# Patient Record
Sex: Male | Born: 1960 | Race: White | Hispanic: No | Marital: Married | Smoking: Former smoker
Health system: Southern US, Community
[De-identification: ages and names within clinical notes are randomized; demographics above are authoritative.]

## PROBLEM LIST (undated history)

## (undated) DIAGNOSIS — C140 Malignant neoplasm of pharynx, unspecified: Secondary | ICD-10-CM

## (undated) HISTORY — DX: Malignant neoplasm of pharynx, unspecified: C14.0

## (undated) HISTORY — PX: GASTROSTOMY W/ FEEDING TUBE: SUR642

---

## 2001-05-22 ENCOUNTER — Emergency Department (HOSPITAL_COMMUNITY): Admission: EM | Admit: 2001-05-22 | Discharge: 2001-05-23 | Payer: Self-pay | Admitting: Emergency Medicine

## 2001-05-23 ENCOUNTER — Encounter: Payer: Self-pay | Admitting: Emergency Medicine

## 2015-11-25 DIAGNOSIS — C099 Malignant neoplasm of tonsil, unspecified: Secondary | ICD-10-CM | POA: Diagnosis not present

## 2015-12-13 DIAGNOSIS — C099 Malignant neoplasm of tonsil, unspecified: Secondary | ICD-10-CM | POA: Diagnosis not present

## 2016-01-03 DIAGNOSIS — H9109 Ototoxic hearing loss, unspecified ear: Secondary | ICD-10-CM | POA: Diagnosis not present

## 2016-01-03 DIAGNOSIS — R42 Dizziness and giddiness: Secondary | ICD-10-CM | POA: Diagnosis not present

## 2016-01-03 DIAGNOSIS — C099 Malignant neoplasm of tonsil, unspecified: Secondary | ICD-10-CM | POA: Diagnosis not present

## 2016-01-24 DIAGNOSIS — C099 Malignant neoplasm of tonsil, unspecified: Secondary | ICD-10-CM | POA: Diagnosis not present

## 2016-03-06 DIAGNOSIS — E860 Dehydration: Secondary | ICD-10-CM | POA: Diagnosis not present

## 2016-03-06 DIAGNOSIS — R531 Weakness: Secondary | ICD-10-CM | POA: Diagnosis not present

## 2016-03-06 DIAGNOSIS — C099 Malignant neoplasm of tonsil, unspecified: Secondary | ICD-10-CM | POA: Diagnosis not present

## 2016-04-17 DIAGNOSIS — K1239 Other oral mucositis (ulcerative): Secondary | ICD-10-CM

## 2016-04-17 DIAGNOSIS — C099 Malignant neoplasm of tonsil, unspecified: Secondary | ICD-10-CM | POA: Diagnosis not present

## 2016-08-15 DIAGNOSIS — Z8579 Personal history of other malignant neoplasms of lymphoid, hematopoietic and related tissues: Secondary | ICD-10-CM | POA: Diagnosis not present

## 2016-08-15 DIAGNOSIS — Z9221 Personal history of antineoplastic chemotherapy: Secondary | ICD-10-CM

## 2016-08-15 DIAGNOSIS — Z923 Personal history of irradiation: Secondary | ICD-10-CM

## 2016-12-15 DIAGNOSIS — Z923 Personal history of irradiation: Secondary | ICD-10-CM

## 2016-12-15 DIAGNOSIS — Z9221 Personal history of antineoplastic chemotherapy: Secondary | ICD-10-CM

## 2016-12-15 DIAGNOSIS — Z8579 Personal history of other malignant neoplasms of lymphoid, hematopoietic and related tissues: Secondary | ICD-10-CM

## 2017-04-30 DIAGNOSIS — Z9221 Personal history of antineoplastic chemotherapy: Secondary | ICD-10-CM

## 2017-04-30 DIAGNOSIS — Z8579 Personal history of other malignant neoplasms of lymphoid, hematopoietic and related tissues: Secondary | ICD-10-CM

## 2017-04-30 DIAGNOSIS — Z923 Personal history of irradiation: Secondary | ICD-10-CM

## 2017-08-28 DIAGNOSIS — Z923 Personal history of irradiation: Secondary | ICD-10-CM

## 2017-08-28 DIAGNOSIS — Z8579 Personal history of other malignant neoplasms of lymphoid, hematopoietic and related tissues: Secondary | ICD-10-CM

## 2017-08-28 DIAGNOSIS — Z9221 Personal history of antineoplastic chemotherapy: Secondary | ICD-10-CM

## 2018-01-01 DIAGNOSIS — R946 Abnormal results of thyroid function studies: Secondary | ICD-10-CM

## 2018-01-01 DIAGNOSIS — Z923 Personal history of irradiation: Secondary | ICD-10-CM

## 2018-01-01 DIAGNOSIS — Z8579 Personal history of other malignant neoplasms of lymphoid, hematopoietic and related tissues: Secondary | ICD-10-CM

## 2018-01-01 DIAGNOSIS — Z9221 Personal history of antineoplastic chemotherapy: Secondary | ICD-10-CM

## 2018-07-04 DIAGNOSIS — Z8579 Personal history of other malignant neoplasms of lymphoid, hematopoietic and related tissues: Secondary | ICD-10-CM

## 2019-01-03 DIAGNOSIS — Z8579 Personal history of other malignant neoplasms of lymphoid, hematopoietic and related tissues: Secondary | ICD-10-CM

## 2019-07-04 DIAGNOSIS — C099 Malignant neoplasm of tonsil, unspecified: Secondary | ICD-10-CM

## 2020-06-14 ENCOUNTER — Encounter: Payer: Self-pay | Admitting: Oncology

## 2020-06-14 DIAGNOSIS — C099 Malignant neoplasm of tonsil, unspecified: Secondary | ICD-10-CM | POA: Insufficient documentation

## 2020-06-15 ENCOUNTER — Inpatient Hospital Stay: Payer: Self-pay

## 2020-06-15 ENCOUNTER — Other Ambulatory Visit: Payer: Self-pay

## 2020-06-15 ENCOUNTER — Other Ambulatory Visit: Payer: Self-pay | Admitting: Oncology

## 2020-06-15 ENCOUNTER — Inpatient Hospital Stay: Payer: Self-pay | Attending: Oncology | Admitting: Oncology

## 2020-06-15 ENCOUNTER — Other Ambulatory Visit: Payer: Self-pay | Admitting: Hematology and Oncology

## 2020-06-15 DIAGNOSIS — C099 Malignant neoplasm of tonsil, unspecified: Secondary | ICD-10-CM

## 2020-06-15 LAB — BASIC METABOLIC PANEL
BUN: 14 (ref 4–21)
CO2: 27 — AB (ref 13–22)
Chloride: 99 (ref 99–108)
Creatinine: 0.7 (ref 0.6–1.3)
Glucose: 94
Potassium: 4 (ref 3.4–5.3)
Sodium: 132 — AB (ref 137–147)

## 2020-06-15 LAB — CBC AND DIFFERENTIAL
HCT: 39 — AB (ref 41–53)
Hemoglobin: 13.8 (ref 13.5–17.5)
Neutrophils Absolute: 4.46
Platelets: 200 (ref 150–399)
WBC: 6.2

## 2020-06-15 LAB — HEPATIC FUNCTION PANEL
ALT: 19 (ref 10–40)
AST: 27 (ref 14–40)
Alkaline Phosphatase: 68 (ref 25–125)
Bilirubin, Total: 0.3

## 2020-06-15 LAB — COMPREHENSIVE METABOLIC PANEL
Albumin: 4.6 (ref 3.5–5.0)
Calcium: 9.1 (ref 8.7–10.7)

## 2020-06-15 LAB — CBC: RBC: 4.06 (ref 3.87–5.11)

## 2020-06-15 LAB — TSH: TSH: 4.304 u[IU]/mL (ref 0.350–4.500)

## 2020-06-22 NOTE — Progress Notes (Signed)
[facility]  [address] [place],  [place]  [address] [phone]  Clinic Day:  06/22/2020  Referring physician: Charlynn Court, NP   HISTORY OF PRESENT ILLNESS:  The patient is a 60 y.o. 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  Since then, all follow-up scans and exams have shown no evidence of disease recurrence.  He comes in today for routine followup.  Since his last visit, the patient has been doing well.  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  He denies having new oropharyngeal symptoms or findings which concern him for possible disease recurrence.   PHYSICAL EXAM:  Blood pressure 128/75, pulse 99, temperature 98.4 F (36.9 C), resp. rate 16, height 5' 9.5" (1.765 m), weight 132 lb 3.2 oz (60 kg), SpO2 96 %. Wt Readings from Last 3 Encounters:  06/15/20 132 lb 3.2 oz (60 kg)   Body mass index is 19.24 kg/m. Performance status (ECOG): 1 - Symptomatic but completely ambulatory Physical Exam Constitutional:      Appearance: Normal appearance. He is not ill-appearing.  HENT:     Mouth/Throat:     Mouth: Mucous membranes are moist.     Pharynx: Oropharynx is clear. No oropharyngeal exudate or posterior oropharyngeal erythema.  Cardiovascular:     Rate and Rhythm: Normal rate and regular rhythm.     Heart sounds: No murmur heard. No friction rub. No gallop.   Pulmonary:     Effort: Pulmonary effort is normal. No respiratory distress.     Breath sounds: Normal breath sounds. No wheezing, rhonchi or rales.  Chest:  Breasts:     Right: No axillary adenopathy or supraclavicular adenopathy.     Left: No axillary adenopathy or supraclavicular adenopathy.    Abdominal:     General: Bowel sounds are normal. There is no distension.     Palpations: Abdomen is soft. There is no mass.      Tenderness: There is no abdominal tenderness.  Musculoskeletal:        General: No swelling.     Right lower leg: No edema.     Left lower leg: No edema.  Lymphadenopathy:     Cervical: No cervical adenopathy.     Upper Body:     Right upper body: No supraclavicular or axillary adenopathy.     Left upper body: No supraclavicular or axillary adenopathy.     Lower Body: No right inguinal adenopathy. No left inguinal adenopathy.  Skin:    General: Skin is warm.     Coloration: Skin is not jaundiced.     Findings: No lesion or rash.  Neurological:     General: No focal deficit present.     Mental Status: He is alert and oriented to person, place, and time. Mental status is at baseline.     Cranial Nerves: Cranial nerves are intact.  Psychiatric:        Mood and Affect: Mood normal.        Behavior: Behavior normal.        Thought Content: Thought content normal.     LABS:   CBC Latest Ref Rng & Units 06/15/2020  WBC - 6.2  Hemoglobin 13.5 - 17.5 13.8  Hematocrit 41 - 53 39(A)  Platelets 150 - 399 200   CMP Latest Ref Rng & Units 06/15/2020  BUN 4 - 21 14  Creatinine 0.6 - 1.3 0.7  Sodium 137 - 147 132(A)  Potassium 3.4 - 5.3 4.0  Chloride 99 - 108 99  CO2 13 - 22 27(A)  Calcium 8.7 - 10.7 9.1  Alkaline Phos 25 - 125 68  AST 14 - 40 27  ALT 10 - 40 19    Ref. Range 06/15/2020 15:59  TSH Latest Ref Range: 0.350 - 4.500 uIU/mL 4.304    ASSESSMENT & PLAN:   Assessment/Plan:  A 60 y.o. 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  Based upon his physical exam today, the patient remains disease-free.  Clinically, the patient appears to be doing very well.  I will see him back in November 2022 for repeat clinical assessment.  If he is cancer free at that time, it will mark her being 5 years disease free for which I would consider him cured of his disease.  The patient understands  all the plans discussed today and is in agreement with them.      Raevin Wierenga Macarthur Critchley, MD

## 2020-06-23 ENCOUNTER — Other Ambulatory Visit: Payer: Self-pay | Admitting: Oncology

## 2020-06-23 DIAGNOSIS — C099 Malignant neoplasm of tonsil, unspecified: Secondary | ICD-10-CM

## 2021-01-12 ENCOUNTER — Telehealth: Payer: Self-pay | Admitting: Oncology

## 2021-01-12 NOTE — Telephone Encounter (Signed)
Patient requested earlier Appt - Patient scheduled for 10/27 Labs 3:30 pm - Follow Up 4:00 pm.  Found lump behind ear

## 2021-01-12 NOTE — Progress Notes (Signed)
[facility]  [address] [place],    [address] [phone]  Clinic Day:  01/13/2021  Referring physician: Charlynn Court, NP   HISTORY OF PRESENT ILLNESS:  The patient is a 60 y.o. 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  Since then, all follow-up scans and exams have shown no evidence of disease recurrence.  He comes in today for routine followup.  Since his last visit, the patient has been doing okay.  He has a few concerns today, including moderate dysphagia.  He also feels fullness in front of and behind his left ear.  Overall, he and his wife are concerned about late disease recurrence.    PHYSICAL EXAM:  Blood pressure 115/75, pulse 81, temperature 98.3 F (36.8 C), resp. rate 16, height 5' 9.5" (1.765 m), weight 128 lb 6.4 oz (58.2 kg), SpO2 97 %. Wt Readings from Last 3 Encounters:  01/13/21 128 lb 6.4 oz (58.2 kg)  06/15/20 132 lb 3.2 oz (60 kg)   Body mass index is 18.69 kg/m. Performance status (ECOG): 1 - Symptomatic but completely ambulatory Physical Exam Constitutional:      Appearance: Normal appearance. He is not ill-appearing.  HENT:     Right Ear: External ear normal.     Left Ear: External ear normal.     Ears:     Comments: No palpable masses or lymphadenopathy around his left ear    Mouth/Throat:     Mouth: Mucous membranes are moist.     Pharynx: Oropharynx is clear. No oropharyngeal exudate or posterior oropharyngeal erythema.  Neck:     Comments: Taut from prior radiation Cardiovascular:     Rate and Rhythm: Normal rate and regular rhythm.     Heart sounds: No murmur heard.   No friction rub. No gallop.  Pulmonary:     Effort: Pulmonary effort is normal. No respiratory distress.     Breath sounds: Normal breath sounds. No wheezing, rhonchi or rales.  Abdominal:     General: Bowel sounds are normal. There is  no distension.     Palpations: Abdomen is soft. There is no mass.     Tenderness: There is no abdominal tenderness.  Musculoskeletal:        General: No swelling.     Right lower leg: No edema.     Left lower leg: No edema.  Lymphadenopathy:     Cervical: No cervical adenopathy.     Upper Body:     Right upper body: No supraclavicular or axillary adenopathy.     Left upper body: No supraclavicular or axillary adenopathy.     Lower Body: No right inguinal adenopathy. No left inguinal adenopathy.  Skin:    General: Skin is warm.     Coloration: Skin is not jaundiced.     Findings: No lesion or rash.  Neurological:     General: No focal deficit present.     Mental Status: He is alert and oriented to person, place, and time. Mental status is at baseline.  Psychiatric:        Mood and Affect: Mood normal.        Behavior: Behavior normal.        Thought Content: Thought content normal.    LABS:   CBC Latest Ref Rng & Units 01/13/2021 06/15/2020  WBC - 6.4 6.2  Hemoglobin 13.5 - 17.5 13.7 13.8  Hematocrit 41 - 53 40(A) 39(A)  Platelets 150 - 399 180 200   CMP Latest Ref Rng & Units 01/13/2021 06/15/2020  BUN 4 - 21 14 14   Creatinine 0.6 - 1.3 1.0 0.7  Sodium 137 - 147 135(A) 132(A)  Potassium 3.4 - 5.3 4.1 4.0  Chloride 99 - 108 104 99  CO2 13 - 22 25(A) 27(A)  Calcium 8.7 - 10.7 8.4(A) 9.1  Alkaline Phos 25 - 125 79 68  AST 14 - 40 27 27  ALT 10 - 40 16 19   ASSESSMENT & PLAN:  Assessment/Plan:  A 60 y.o. 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  Based upon his physical exam today, I do not appreciate anything that concerns me for late disease recurrence.  Despite hearing this, the patient and his wife are concerned with his perceived findings.  Although I really do not believe anything ominous is present around his left ear, I will have him undergo CT scans of his neck and  sinuses for further clarification.  I also recommended he see a GI specialist as his dysphagia may be postradiation induced.  I reinforced multiple times today that I do not sense anything per his history or physical exam that is worrisome for late disease recurrence.  I will see him back net week to go over his CT scans.  If his scans show him to remain disease free, it will mark him being 5 years disease free for which I would consider him cured of his disease.  The patient and his wife understand all the plans discussed today and are in agreement with them.    Carmin Dibartolo Macarthur Critchley, MD

## 2021-01-13 ENCOUNTER — Inpatient Hospital Stay (HOSPITAL_BASED_OUTPATIENT_CLINIC_OR_DEPARTMENT_OTHER): Payer: Self-pay | Admitting: Oncology

## 2021-01-13 ENCOUNTER — Other Ambulatory Visit: Payer: Self-pay | Admitting: Oncology

## 2021-01-13 ENCOUNTER — Inpatient Hospital Stay: Payer: Self-pay | Attending: Oncology

## 2021-01-13 ENCOUNTER — Other Ambulatory Visit: Payer: Self-pay | Admitting: Hematology and Oncology

## 2021-01-13 VITALS — BP 115/75 | HR 81 | Temp 98.3°F | Resp 16 | Ht 69.5 in | Wt 128.4 lb

## 2021-01-13 DIAGNOSIS — Z923 Personal history of irradiation: Secondary | ICD-10-CM | POA: Insufficient documentation

## 2021-01-13 DIAGNOSIS — C099 Malignant neoplasm of tonsil, unspecified: Secondary | ICD-10-CM

## 2021-01-13 DIAGNOSIS — R1310 Dysphagia, unspecified: Secondary | ICD-10-CM | POA: Insufficient documentation

## 2021-01-13 DIAGNOSIS — Z8589 Personal history of malignant neoplasm of other organs and systems: Secondary | ICD-10-CM | POA: Insufficient documentation

## 2021-01-13 LAB — CBC
MCV: 97 — AB (ref 80–94)
RBC: 4.12 (ref 3.87–5.11)

## 2021-01-13 LAB — COMPREHENSIVE METABOLIC PANEL
Albumin: 4.2 (ref 3.5–5.0)
Calcium: 8.4 — AB (ref 8.7–10.7)

## 2021-01-13 LAB — TSH: TSH: 5.127 u[IU]/mL — ABNORMAL HIGH (ref 0.350–4.500)

## 2021-01-13 LAB — CBC AND DIFFERENTIAL
HCT: 40 — AB (ref 41–53)
Hemoglobin: 13.7 (ref 13.5–17.5)
Neutrophils Absolute: 4.8
Platelets: 180 (ref 150–399)
WBC: 6.4

## 2021-01-13 LAB — BASIC METABOLIC PANEL
BUN: 14 (ref 4–21)
CO2: 25 — AB (ref 13–22)
Chloride: 104 (ref 99–108)
Creatinine: 1 (ref 0.6–1.3)
Glucose: 92
Potassium: 4.1 (ref 3.4–5.3)
Sodium: 135 — AB (ref 137–147)

## 2021-01-13 LAB — HEPATIC FUNCTION PANEL
ALT: 16 (ref 10–40)
AST: 27 (ref 14–40)
Alkaline Phosphatase: 79 (ref 25–125)
Bilirubin, Total: 0.4

## 2021-01-14 ENCOUNTER — Telehealth: Payer: Self-pay | Admitting: Oncology

## 2021-01-14 NOTE — Progress Notes (Incomplete)
[facility]  [address] [place],  [place]  [address] [phone]  Clinic Day:  01/14/2021  Referring physician: Charlynn Court, NP  This document serves as a record of services personally performed by [name], MD. It was created on their behalf by Curry,Lauren E, a trained medical scribe. The creation of this record is based on the scribe's personal observations and the provider's statements to them.  HISTORY OF PRESENT ILLNESS:  The patient is a 60 y.o. 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  Since then, all follow-up scans and exams have shown no evidence of disease recurrence.  He comes in today for routine followup.  Since his last visit, the patient has been doing okay.  He has a few concerns today, including moderate dysphagia.  He also feels fullness in front of and behind his left ear.  Overall, he and his wife are concerned about late disease recurrence.    PHYSICAL EXAM:  There were no vitals taken for this visit. Wt Readings from Last 3 Encounters:  01/13/21 128 lb 6.4 oz (58.2 kg)  06/15/20 132 lb 3.2 oz (60 kg)   There is no height or weight on file to calculate BMI. Performance status (ECOG): 1 - Symptomatic but completely ambulatory Physical Exam Constitutional:      Appearance: Normal appearance. He is not ill-appearing.  HENT:     Right Ear: External ear normal.     Left Ear: External ear normal.     Ears:     Comments: No palpable masses or lymphadenopathy around his left ear    Mouth/Throat:     Mouth: Mucous membranes are moist.     Pharynx: Oropharynx is clear. No oropharyngeal exudate or posterior oropharyngeal erythema.  Neck:     Comments: Taut from prior radiation Cardiovascular:     Rate and Rhythm: Normal rate and regular rhythm.     Heart sounds: No murmur heard.   No friction rub. No gallop.  Pulmonary:      Effort: Pulmonary effort is normal. No respiratory distress.     Breath sounds: Normal breath sounds. No wheezing, rhonchi or rales.  Abdominal:     General: Bowel sounds are normal. There is no distension.     Palpations: Abdomen is soft. There is no mass.     Tenderness: There is no abdominal tenderness.  Musculoskeletal:        General: No swelling.     Right lower leg: No edema.     Left lower leg: No edema.  Lymphadenopathy:     Cervical: No cervical adenopathy.     Upper Body:     Right upper body: No supraclavicular or axillary adenopathy.     Left upper body: No supraclavicular or axillary adenopathy.     Lower Body: No right inguinal adenopathy. No left inguinal adenopathy.  Skin:    General: Skin is warm.     Coloration: Skin is not jaundiced.     Findings: No lesion or rash.  Neurological:     General: No focal deficit present.     Mental Status: He is alert and oriented to person, place, and time. Mental status is at baseline.  Psychiatric:        Mood and Affect: Mood normal.        Behavior: Behavior normal.        Thought  Content: Thought content normal.   SCANS: Recent CT head and neck has revealed the following: ***  LABS:   CBC Latest Ref Rng & Units 01/13/2021 06/15/2020  WBC - 6.4 6.2  Hemoglobin 13.5 - 17.5 13.7 13.8  Hematocrit 41 - 53 40(A) 39(A)  Platelets 150 - 399 180 200   CMP Latest Ref Rng & Units 01/13/2021 06/15/2020  BUN 4 - 21 14 14   Creatinine 0.6 - 1.3 1.0 0.7  Sodium 137 - 147 135(A) 132(A)  Potassium 3.4 - 5.3 4.1 4.0  Chloride 99 - 108 104 99  CO2 13 - 22 25(A) 27(A)  Calcium 8.7 - 10.7 8.4(A) 9.1  Alkaline Phos 25 - 125 79 68  AST 14 - 40 27 27  ALT 10 - 40 16 19   ASSESSMENT & PLAN:  Assessment/Plan:  A 60 y.o. 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  Based upon his physical exam today, I do not appreciate anything that  concerns me for late disease recurrence.  Despite hearing this, the patient and his wife are concerned with his perceived findings.  Although I really do not believe anything ominous is present around his left ear, I will have him undergo CT scans of his neck and sinuses for further clarification.  I also recommended he see a GI specialist as his dysphagia may be postradiation induced.  I reinforced multiple times today that I do not sense anything per his history or physical exam that is worrisome for late disease recurrence.  I will see him back next week to go over his CT scans.  If his scans show him to remain disease free, it will mark him being 5 years disease free for which I would consider him cured of his disease.  The patient and his wife understand all the plans discussed today and are in agreement with them.    I, Rita Ohara, am acting as scribe for [name], MD    I have reviewed this report as typed by the medical scribe, and it is complete and accurate.  Dequincy Macarthur Critchley, MD

## 2021-01-14 NOTE — Telephone Encounter (Signed)
01/14/21 LEFT MSG -SCANS SCHEDULED ON 01/19/21@830AM 

## 2021-01-17 ENCOUNTER — Telehealth: Payer: Self-pay | Admitting: Oncology

## 2021-01-17 NOTE — Progress Notes (Signed)
[facility]  [address] [place],  [place]  [address] [phone]  Clinic Day:  01/21/2021  Referring physician: Charlynn Court, NP  This document serves as a record of services personally performed by [name], MD. It was created on their behalf by Curry,Lauren E, a trained medical scribe. The creation of this record is based on the scribe's personal observations and the provider's statements to them.  HISTORY OF PRESENT ILLNESS:  The patient is a 60 y.o. 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  He comes in today to review recent CT scans of his face and neck, which were done as he was concerned about areas of perceived left periauricular fullness.  His main concern is possible late disease recurrence.  The patient brings to my attention that when he turns his neck to the left, the vision in his left eye goes down and he begins feeling lightheaded.    PHYSICAL EXAM:  Blood pressure 108/68, pulse 82, temperature 98 F (36.7 C), resp. rate 16, height 5' 9.5" (1.765 m), weight 128 lb 14.4 oz (58.5 kg), SpO2 97 %. Wt Readings from Last 3 Encounters:  01/21/21 128 lb 14.4 oz (58.5 kg)  01/13/21 128 lb 6.4 oz (58.2 kg)  06/15/20 132 lb 3.2 oz (60 kg)   Body mass index is 18.76 kg/m. Performance status (ECOG): 1 - Symptomatic but completely ambulatory Physical Exam Constitutional:      Appearance: Normal appearance. He is not ill-appearing.  HENT:     Right Ear: External ear normal.     Left Ear: External ear normal.     Ears:     Comments: No palpable masses or lymphadenopathy around his left ear    Mouth/Throat:     Mouth: Mucous membranes are moist.     Pharynx: Oropharynx is clear. No oropharyngeal exudate or posterior oropharyngeal erythema.  Neck:     Comments: Taut from prior radiation Cardiovascular:     Rate and Rhythm: Normal rate  and regular rhythm.     Heart sounds: No murmur heard.   No friction rub. No gallop.  Pulmonary:     Effort: Pulmonary effort is normal. No respiratory distress.     Breath sounds: Normal breath sounds. No wheezing, rhonchi or rales.  Abdominal:     General: Bowel sounds are normal. There is no distension.     Palpations: Abdomen is soft. There is no mass.     Tenderness: There is no abdominal tenderness.  Musculoskeletal:        General: No swelling.     Right lower leg: No edema.     Left lower leg: No edema.  Lymphadenopathy:     Cervical: No cervical adenopathy.     Upper Body:     Right upper body: No supraclavicular or axillary adenopathy.     Left upper body: No supraclavicular or axillary adenopathy.     Lower Body: No right inguinal adenopathy. No left inguinal adenopathy.  Skin:    General: Skin is warm.     Coloration: Skin is not jaundiced.     Findings: No lesion or rash.  Neurological:     General: No focal deficit present.     Mental Status: He is alert and oriented to person, place, and time. Mental status is at baseline.  Psychiatric:        Mood and  Affect: Mood normal.        Behavior: Behavior normal.        Thought Content: Thought content normal.   SCANS: Recent CT imaging of face and neck has revealed the following:  FINDINGS: Osseous: No fracture or bone lesion identified. Orbits: No orbital mass or edema. Sinuses: Mild mucosal edema in the paranasal sinuses. No air-fluid level. Mastoid sinus clear bilaterally. Soft tissues: Diffuse soft tissue edema in the neck bilaterally as well as in the larynx compatible with prior radiation. Bb marks an area of palpable lump behind and inferior to the left ear. There is mild fullness of the soft tissue in this area. Limited evaluation without intravenous contrast. Limited intracranial: No acute abnormality.  IMPRESSION: 1. Mild fullness of the soft tissues posterior left ear. Limited evaluation without  intravenous contrast. It is difficult to determine if this is a lymph node. If further imaging is desired, postcontrast imaging with CT would be helpful. Otherwise MRI without and with contrast may be helpful. 2. Post radiation soft tissue changes in the neck bilaterally. ---------- FINDINGS: Pharynx and larynx: Edema is present in the epiglottis and larynx consistent with prior radiation. No pharyngeal mass. Salivary glands: Limited evaluation without intravenous contrast. No parotid mass. Atrophic submandibular gland due to prior radiation. Thyroid: No focal nodule. Lymph nodes: No enlarged lymph nodes in the neck. Evaluation limited by lack of intravenous contrast. Vascular: Limited evaluation without intravenous contrast. Right jugular Port-A-Cath noted. Limited intracranial: Negative Visualized orbits: Negative Mastoids and visualized paranasal sinuses: Mild mucosal edema ethmoid sinuses. Remaining sinuses clear. Skeleton: No acute skeletal abnormality. Upper chest: Pleuroparenchymal scarring in the apices bilaterally right greater than left. No mass lesion Other: Soft tissue edema is present throughout the neck bilaterally most consistent with prior radiation change.  IMPRESSION: Negative for mass or adenopathy. Post radiation changes throughout the soft tissues of the neck and involving the larynx. Limited evaluation without intravenous contrast.  LABS:   CBC Latest Ref Rng & Units 01/13/2021 06/15/2020  WBC - 6.4 6.2  Hemoglobin 13.5 - 17.5 13.7 13.8  Hematocrit 41 - 53 40(A) 39(A)  Platelets 150 - 399 180 200   CMP Latest Ref Rng & Units 01/13/2021 06/15/2020  BUN 4 - 21 14 14   Creatinine 0.6 - 1.3 1.0 0.7  Sodium 137 - 147 135(A) 132(A)  Potassium 3.4 - 5.3 4.1 4.0  Chloride 99 - 108 104 99  CO2 13 - 22 25(A) 27(A)  Calcium 8.7 - 10.7 8.4(A) 9.1  Alkaline Phos 25 - 125 79 68  AST 14 - 40 27 27  ALT 10 - 40 16 19   ASSESSMENT & PLAN:  Assessment/Plan:  A 60  y.o. 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  In clinic today, I went over his CT scan images with him, for which no obvious disease recurrence is seen.  However, because the study was limited by a lack of contrast, the patient was not comforted by the results of his scans.  Furthermore, he has become concerned with his left-sided vision changes when he turns his neck to the left.  After speaking with radiologoy to figure out the best studies to order, the decision was made to do an MRI of his face and an MRA of his neck. His vision problems have me concerned that his radiation has affected his carotid arteries, limiting the amount of blood flow to  his brain. These studies will be done within the forthcoming days. I once again told him that my index of suspicion for late disease recurrence being present is very low.  I will see this patient back the day after the aforementioned scans are done to go over their results and their implications.  The patient and his wife understand all the plans discussed today and are in agreement with them.     I, Rita Ohara, am acting as scribe for [name], MD    I have reviewed this report as typed by the medical scribe, and it is complete and accurate.  Dequincy Macarthur Critchley, MD

## 2021-01-17 NOTE — Telephone Encounter (Signed)
01/17/21 lft vm about upcoming appts

## 2021-01-20 ENCOUNTER — Ambulatory Visit: Payer: Self-pay | Admitting: Oncology

## 2021-01-21 ENCOUNTER — Encounter: Payer: Self-pay | Admitting: Oncology

## 2021-01-21 ENCOUNTER — Inpatient Hospital Stay: Payer: Self-pay | Attending: Oncology | Admitting: Oncology

## 2021-01-21 ENCOUNTER — Other Ambulatory Visit: Payer: Self-pay | Admitting: Oncology

## 2021-01-21 VITALS — BP 108/68 | HR 82 | Temp 98.0°F | Resp 16 | Ht 69.5 in | Wt 128.9 lb

## 2021-01-21 DIAGNOSIS — C099 Malignant neoplasm of tonsil, unspecified: Secondary | ICD-10-CM

## 2021-01-21 NOTE — Progress Notes (Signed)
Patient needs to get scans through [facility], provided him with financial assistance paperwork. He is going to bring me in completed papers next week, I will submit then.

## 2021-01-24 ENCOUNTER — Ambulatory Visit: Payer: Self-pay | Admitting: Oncology

## 2021-01-24 NOTE — Progress Notes (Incomplete)
Larry Marks  [address] [place],  [place]  [address] [phone]  Clinic Day:  01/28/2021  Referring physician: Charlynn Court, NP  This document serves as a record of services personally performed by Larry Potter, MD. It was created on their behalf by Curry,Lauren E, a trained medical scribe. The creation of this record is based on the scribe's personal observations and the provider's statements to them.  HISTORY OF PRESENT ILLNESS:  The patient is a 60 y.o. 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  He comes in today to review recent MRI scans of his face and neck, which were done as he was concerned about areas of perceived left periauricular fullness.  His main concern is possible late disease recurrence.  The patient brings to my attention that when he turns his neck to the left, the vision in his left eye goes down and he begins feeling lightheaded.    PHYSICAL EXAM:  There were no vitals taken for this visit. Wt Readings from Last 3 Encounters:  01/21/21 128 lb 14.4 oz (58.5 kg)  01/13/21 128 lb 6.4 oz (58.2 kg)  06/15/20 132 lb 3.2 oz (60 kg)   There is no height or weight on file to calculate BMI. Performance status (ECOG): 1 - Symptomatic but completely ambulatory Physical Exam Constitutional:      Appearance: Normal appearance. He is not ill-appearing.  HENT:     Right Ear: External ear normal.     Left Ear: External ear normal.     Ears:     Comments: No palpable masses or lymphadenopathy around his left ear    Mouth/Throat:     Mouth: Mucous membranes are moist.     Pharynx: Oropharynx is clear. No oropharyngeal exudate or posterior oropharyngeal erythema.  Neck:     Comments: Taut from prior radiation Cardiovascular:     Rate and Rhythm: Normal rate and regular rhythm.     Heart sounds: No murmur heard.   No friction rub. No  gallop.  Pulmonary:     Effort: Pulmonary effort is normal. No respiratory distress.     Breath sounds: Normal breath sounds. No wheezing, rhonchi or rales.  Abdominal:     General: Bowel sounds are normal. There is no distension.     Palpations: Abdomen is soft. There is no mass.     Tenderness: There is no abdominal tenderness.  Musculoskeletal:        General: No swelling.     Right lower leg: No edema.     Left lower leg: No edema.  Lymphadenopathy:     Cervical: No cervical adenopathy.     Upper Body:     Right upper body: No supraclavicular or axillary adenopathy.     Left upper body: No supraclavicular or axillary adenopathy.     Lower Body: No right inguinal adenopathy. No left inguinal adenopathy.  Skin:    General: Skin is warm.     Coloration: Skin is not jaundiced.     Findings: No lesion or rash.  Neurological:     General: No focal deficit present.     Mental Status: He is alert and oriented to person, place, and time. Mental status is at baseline.  Psychiatric:        Mood and Affect: Mood normal.        Behavior: Behavior normal.  Thought Content: Thought content normal.   SCANS: MRI imaging has revealed the following: ***  LABS:   CBC Latest Ref Rng & Units 01/13/2021 06/15/2020  WBC - 6.4 6.2  Hemoglobin 13.5 - 17.5 13.7 13.8  Hematocrit 41 - 53 40(A) 39(A)  Platelets 150 - 399 180 200   CMP Latest Ref Rng & Units 01/13/2021 06/15/2020  BUN 4 - 21 14 14   Creatinine 0.6 - 1.3 1.0 0.7  Sodium 137 - 147 135(A) 132(A)  Potassium 3.4 - 5.3 4.1 4.0  Chloride 99 - 108 104 99  CO2 13 - 22 25(A) 27(A)  Calcium 8.7 - 10.7 8.4(A) 9.1  Alkaline Phos 25 - 125 79 68  AST 14 - 40 27 27  ALT 10 - 40 16 19   ASSESSMENT & PLAN:  Assessment/Plan:  A 60 y.o. 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  In clinic today, I went over his CT scan images with him, for  which no obvious disease recurrence is seen.  However, because the study was limited by a lack of contrast, the patient was not comforted by the results of his scans.  Furthermore, he has become concerned with his left-sided vision changes when he turns his neck to the left.  After speaking with radiologoy to figure out the best studies to order, the decision was made to do an MRI of his face and  an MRA of his neck. His vision problems have me concerned that his radiation has affected his carotid arteries, limiting the amount of blood flow to his brain. These studies will be done within the forthcoming days. I once again told him that my index of suspicion for late disease recurrence being present is very low.  I will see this patient back the day after the aforementioned scans are done to go over their results and their implications.  The patient and his wife understand all the plans discussed today and are in agreement with them.     I, Rita Ohara, am acting as scribe for Larry Potter, MD    I have reviewed this report as typed by the medical scribe, and it is complete and accurate.  Dequincy Macarthur Critchley, MD

## 2021-01-28 ENCOUNTER — Ambulatory Visit: Payer: Self-pay | Admitting: Oncology

## 2021-01-28 ENCOUNTER — Inpatient Hospital Stay (HOSPITAL_BASED_OUTPATIENT_CLINIC_OR_DEPARTMENT_OTHER): Payer: Self-pay | Admitting: Oncology

## 2021-01-28 VITALS — BP 115/66 | HR 112 | Temp 98.2°F | Resp 16 | Ht 69.5 in | Wt 128.5 lb

## 2021-01-28 DIAGNOSIS — C099 Malignant neoplasm of tonsil, unspecified: Secondary | ICD-10-CM

## 2021-01-28 NOTE — Progress Notes (Signed)
[facility]  [address] [place],  [place]  [address] [phone]  Clinic Day:  01/28/2021  Referring physician: Charlynn Court, NP  This document serves as a record of services personally performed by [name], MD. It was created on their behalf by Curry,Lauren E, a trained medical scribe. The creation of this record is based on the scribe's personal observations and the provider's statements to them.  HISTORY OF PRESENT ILLNESS:  The patient is a 60 y.o. 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  He comes in today to review recent MRI scans of his face and neck, which were done as he was concerned about areas of perceived left periauricular fullness being recurrent disease. He still complains that when he turns his neck to the left, the vision in his left eye goes down and he begins feeling lightheaded.    PHYSICAL EXAM:  Blood pressure 115/66, pulse (!) 112, temperature 98.2 F (36.8 C), resp. rate 16, height 5' 9.5" (1.765 m), weight 128 lb 8 oz (58.3 kg), SpO2 98 %. Wt Readings from Last 3 Encounters:  01/28/21 128 lb 8 oz (58.3 kg)  01/21/21 128 lb 14.4 oz (58.5 kg)  01/13/21 128 lb 6.4 oz (58.2 kg)   Body mass index is 18.7 kg/m. Performance status (ECOG): 1 - Symptomatic but completely ambulatory Physical Exam Constitutional:      Appearance: Normal appearance. He is not ill-appearing.  HENT:     Right Ear: External ear normal.     Left Ear: External ear normal.     Ears:     Comments: No palpable masses or lymphadenopathy around his left ear    Mouth/Throat:     Mouth: Mucous membranes are moist.     Pharynx: Oropharynx is clear. No oropharyngeal exudate or posterior oropharyngeal erythema.  Neck:     Comments: Taut from prior radiation Cardiovascular:     Rate and Rhythm: Normal rate and regular rhythm.     Heart sounds: No  murmur heard.   No friction rub. No gallop.  Pulmonary:     Effort: Pulmonary effort is normal. No respiratory distress.     Breath sounds: Normal breath sounds. No wheezing, rhonchi or rales.  Abdominal:     General: Bowel sounds are normal. There is no distension.     Palpations: Abdomen is soft. There is no mass.     Tenderness: There is no abdominal tenderness.  Musculoskeletal:        General: No swelling.     Right lower leg: No edema.     Left lower leg: No edema.  Lymphadenopathy:     Cervical: No cervical adenopathy.     Upper Body:     Right upper body: No supraclavicular or axillary adenopathy.     Left upper body: No supraclavicular or axillary adenopathy.     Lower Body: No right inguinal adenopathy. No left inguinal adenopathy.  Skin:    General: Skin is warm.     Coloration: Skin is not jaundiced.     Findings: No lesion or rash.  Neurological:     General: No focal deficit present.     Mental Status: He is alert and oriented to person, place, and time. Mental status is at baseline.  Psychiatric:        Mood and Affect: Mood normal.  Behavior: Behavior normal.        Thought Content: Thought content normal.   SCANS: Recent imaging has revealed the following:   FINDINGS:  There is mucosal and submucosal edema and enhancement throughout the  oropharynx, hypopharynx and supraglottic region to the glottis  consistent with chronic radiation change. There is no evidence of a  focal recurrent mass lesion in the region.  Parotid and submandibular glands show atrophic changes without a  focal lesion.  The thyroid gland is normal.  There is no lymphadenopathy noted on either side of the neck.  Regional bones appear unremarkable, with post radiation fatty  change. No sign of metastatic disease to bone.  Posterior fossa intracranial contents are unremarkable. Visualized  sinuses are clear.  No abnormality seen at the lung apices.  IMPRESSION:   Post radiation  mucosal and submucosal changes as above. No evidence  of residual or recurrent disease or lymphadenopathy.   LABS:   CBC Latest Ref Rng & Units 01/13/2021 06/15/2020  WBC - 6.4 6.2  Hemoglobin 13.5 - 17.5 13.7 13.8  Hematocrit 41 - 53 40(A) 39(A)  Platelets 150 - 399 180 200   CMP Latest Ref Rng & Units 01/13/2021 06/15/2020  BUN 4 - 21 14 14   Creatinine 0.6 - 1.3 1.0 0.7  Sodium 137 - 147 135(A) 132(A)  Potassium 3.4 - 5.3 4.1 4.0  Chloride 99 - 108 104 99  CO2 13 - 22 25(A) 27(A)  Calcium 8.7 - 10.7 8.4(A) 9.1  Alkaline Phos 25 - 125 79 68  AST 14 - 40 27 27  ALT 10 - 40 16 19   ASSESSMENT & PLAN:  Assessment/Plan:  A 60 y.o. 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 In clinic, I reviewed his MRI  images with him, for which he could see there is no evidence of disease recurrence.  As he is 5 years cancer free, I do feel very comfortable in considering him cured of his disease.  Nevertheless, I will refer him to Neurology for this symptoms he has when turning his head to the left.  Otherwise, I will turn his care back over to his other physicians.  The patient and his wife understand all the plans discussed today and are in agreement with them.     I, Rita Ohara, am acting as scribe for [name], MD    I have reviewed this report as typed by the medical scribe, and it is complete and accurate.  Acelyn Basham Macarthur Critchley, MD

## 2021-02-02 ENCOUNTER — Encounter: Payer: Self-pay | Admitting: Neurology

## 2021-02-07 ENCOUNTER — Telehealth: Payer: Self-pay | Admitting: Oncology

## 2021-02-07 NOTE — Telephone Encounter (Signed)
Per 11/11 Office Note:  Patient released from Clinic

## 2021-02-14 ENCOUNTER — Encounter: Payer: Self-pay | Admitting: Oncology

## 2021-03-22 NOTE — Progress Notes (Signed)
NEUROLOGY CONSULTATION NOTE  Larry Marks MRN: [id number] DOB: [birth year]  Referring provider: Lavera Guise, MD Primary care provider: Waldemar Dickens  Reason for consult:  near syncope  Assessment/Plan:   Vision loss with head turn to left - semiology seems consistent with vascular etiology due to compression of the carotid artery or subclavian steal.  He reports that he had an MRA of the neck performed.  I don't have imaging.  The report does not describe the extracranial vessels.  Will contact [facility] to see if there is an MRA report. 2.  Left facial nerve palsy - Bell's palsy unrelated to above symptoms but given prior history, would evaluate for other etiologies  MRI of brain and cervical spine with and without contrast WIll obtain MRA of neck.  Further recommendations pending results. If MRI unrevealing and facial weakness not significantly improved in 6 months, would evaluate for other etiologies.  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  History supplemented by referring provider's note.  Patient completed chemoradiation in November 2017 for squamous cell carcinoma of his left tonsil.  He developed tightness of his neck following treatment and has decreased range of motion  Around 2019, he would start experiencing vision loss in his left eye and tingling on the left side of his head.  He would quickly turn his neck back before symptoms progressed.  For the past few months, symptoms have gotten worse.  He has an aural fullness in his left ear with numbness behind the ear.  He started noticing knots on the left side of his neck.  Sometimes if he would press it, he would feel the left sided numbness of the head and neck.  No pain or numbness involving the left arm.  He was concerned about recurrent disease.  CT face and soft tissue of neck in November showed mild  fullness of the soft tissues posterior to left ear and post radiation soft tissue changes in the neck bilaterally.  MRI of orbit, face and neck on 01/26/2021 showed post radiation mucosal and submucosal changes but no evidence of residual or recurrent SSC or lymphadenopathy.  His PCP thought that the "knots" were likely muscle spasms and he was prescribed muscle relaxers and prednisone which helped but symptoms returned once he finished the prednisone  About 3 weeks ago, he developed left upper and lower facial weakness.  It progressed over 2 days.  He cannot close his eyes and he has left facial droop.  No associated facial numbness, altered taste or hyperacusis in left ear.  He was diagnosed with Bell's palsy.  He did not receive prednisone or valacyclovir.  He also has noticed some increased trouble swallowing.    PAST MEDICAL HISTORY: Past Medical History:  Diagnosis Date   Throat cancer [facility])     MEDICATIONS: No current outpatient medications on file prior to visit.   No current facility-administered medications on file prior to visit.      ALLERGIES: Allergies  Allergen Reactions   Iodine Anaphylaxis   Other Anaphylaxis   Shellfish Allergy Anaphylaxis   Penicillins Hives    Other reaction(s): Other (See Comments) Unknown    Ivp Dye [Iodinated Contrast Media]     FAMILY HISTORY: No family history on file.  Objective:  Blood pressure 124/78, pulse 88, height 5\' 9"  (1.753 m), weight 130 lb 12.8 oz (59.3 kg), SpO2  96 %. General: No acute distress.  Patient appears well-groomed.   Head:  Normocephalic/atraumatic Eyes:  fundi examined but not visualized Neck: tight but no paraspinal tenderness, decreased range of motion in all directions primarily neck turn to left. Back: No paraspinal tenderness Heart: regular rate and rhythm Lungs: Clear to auscultation bilaterally. Vascular: No carotid bruits. Neurological Exam: Mental status: alert and oriented to person, place, and time,  recent and remote memory intact, fund of knowledge intact, attention and concentration intact, speech fluent and not dysarthric, language intact. Cranial nerves: CN I: not tested CN II: pupils equal, round and reactive to light, visual fields intact CN III, IV, VI:  full range of motion, no nystagmus, no ptosis CN V: facial sensation intact. CN VII: left upper and lower facial weakness CN VIII: hearing intact CN IX, X: gag intact, uvula midline CN XI: sternocleidomastoid and trapezius muscles intact CN XII: tongue midline Bulk & Tone: normal, no fasciculations. Motor:  muscle strength 5/5 throughout Sensation:  Pinprick, temperature and vibratory sensation intact. Deep Tendon Reflexes:  2+ throughout,  toes downgoing.   Finger to nose testing:  Without dysmetria.   Heel to shin:  Without dysmetria.   Gait:  Normal station and stride.  Romberg negative.    Thank you for allowing me to take part in the care of this patient.  Metta Clines, DO  CC:  Lavera Guise, MD  Maggie Schwalbe, PA-C

## 2021-03-23 ENCOUNTER — Encounter: Payer: Self-pay | Admitting: Neurology

## 2021-03-23 ENCOUNTER — Ambulatory Visit (INDEPENDENT_AMBULATORY_CARE_PROVIDER_SITE_OTHER): Payer: Self-pay | Admitting: Neurology

## 2021-03-23 ENCOUNTER — Other Ambulatory Visit: Payer: Self-pay

## 2021-03-23 VITALS — BP 124/78 | HR 88 | Ht 69.0 in | Wt 130.8 lb

## 2021-03-23 DIAGNOSIS — H5462 Unqualified visual loss, left eye, normal vision right eye: Secondary | ICD-10-CM

## 2021-03-23 DIAGNOSIS — C099 Malignant neoplasm of tonsil, unspecified: Secondary | ICD-10-CM

## 2021-03-23 DIAGNOSIS — G510 Bell's palsy: Secondary | ICD-10-CM

## 2021-03-23 NOTE — Patient Instructions (Signed)
Check MRI of brain and cervical spine with and without contrast.  Will find out if MRA of neck was actually performed. Further recommendations pending results. If facial weakness does not exhibit any significant improvement after 6 months, and MRI unremarkable, then would check other labs Follow up 6 months (or sooner if needed)

## 2021-04-10 ENCOUNTER — Ambulatory Visit
Admission: RE | Admit: 2021-04-10 | Discharge: 2021-04-10 | Disposition: A | Discharge status: Home / Self Care | Payer: Self-pay | Source: Ambulatory Visit | Attending: Neurology | Admitting: Neurology

## 2021-04-10 ENCOUNTER — Other Ambulatory Visit: Payer: Self-pay

## 2021-04-10 DIAGNOSIS — G510 Bell's palsy: Secondary | ICD-10-CM

## 2021-04-10 DIAGNOSIS — C099 Malignant neoplasm of tonsil, unspecified: Secondary | ICD-10-CM

## 2021-04-10 DIAGNOSIS — H5462 Unqualified visual loss, left eye, normal vision right eye: Secondary | ICD-10-CM

## 2021-04-10 MED ORDER — GADOBENATE DIMEGLUMINE 529 MG/ML IV SOLN
12.0000 mL | Freq: Once | INTRAVENOUS | Status: AC | PRN
  Administered 2021-04-10: 12 mL via INTRAVENOUS

## 2021-04-11 NOTE — Progress Notes (Signed)
Pt wife advised of MRI Brain. Cervical spine results pending.

## 2021-04-13 ENCOUNTER — Telehealth: Payer: Self-pay | Admitting: Neurology

## 2021-04-13 NOTE — Telephone Encounter (Signed)
The following message was left with [employer] on 04/12/21 at 4:51PM.  Caller requesting 2nd MRI results.

## 2021-04-13 NOTE — Progress Notes (Signed)
Patient wife advised of the MRI Cervical spine. We will call back once Dr.Jaffe reviews MRA neck.

## 2021-04-13 NOTE — Telephone Encounter (Signed)
Pt wife advised of the MRA neck results.  Please follow up with PCP per dr.Jaffe.

## 2021-04-13 NOTE — Telephone Encounter (Signed)
MRA of neck was normal.  I do not have an explanation for his vision loss.

## 2021-04-13 NOTE — Telephone Encounter (Signed)
Patient's wife returned call about results. 

## 2021-05-04 ENCOUNTER — Ambulatory Visit: Payer: Self-pay | Admitting: Neurology

## 2021-05-24 ENCOUNTER — Other Ambulatory Visit: Payer: Self-pay

## 2021-05-24 ENCOUNTER — Encounter: Payer: Self-pay | Admitting: Neurology

## 2021-05-24 ENCOUNTER — Other Ambulatory Visit (INDEPENDENT_AMBULATORY_CARE_PROVIDER_SITE_OTHER): Payer: Self-pay

## 2021-05-24 ENCOUNTER — Telehealth: Payer: Self-pay | Admitting: Neurology

## 2021-05-24 ENCOUNTER — Telehealth (INDEPENDENT_AMBULATORY_CARE_PROVIDER_SITE_OTHER): Payer: Self-pay | Admitting: Neurology

## 2021-05-24 DIAGNOSIS — H5462 Unqualified visual loss, left eye, normal vision right eye: Secondary | ICD-10-CM

## 2021-05-24 DIAGNOSIS — M542 Cervicalgia: Secondary | ICD-10-CM

## 2021-05-24 DIAGNOSIS — G510 Bell's palsy: Secondary | ICD-10-CM

## 2021-05-24 DIAGNOSIS — G8929 Other chronic pain: Secondary | ICD-10-CM

## 2021-05-24 DIAGNOSIS — C099 Malignant neoplasm of tonsil, unspecified: Secondary | ICD-10-CM

## 2021-05-24 NOTE — Addendum Note (Signed)
Addended by: Venetia Night on: 05/24/2021 12:17 PM ? ? Modules accepted: Orders ? ?

## 2021-05-24 NOTE — Telephone Encounter (Signed)
Per pt, his symptoms have gotten worse. The right starting to swell up. Per PCP see if the test that were going to ran six months from pt last to be done sooner. ? ?Per pt he having trouble eating it makes him choke. Even with drinking water it hard to do.  ? ? ?Please.  ?

## 2021-05-24 NOTE — Progress Notes (Signed)
? ?Virtual Visit via Video Note ?The purpose of this virtual visit is to provide medical care while limiting exposure to the novel coronavirus.   ? ?Consent was obtained for video visit:  Yes.   ?Answered questions that patient had about telehealth interaction:  Yes.   ?I discussed the limitations, risks, security and privacy concerns of performing an evaluation and management service by telemedicine. I also discussed with the patient that there may be a patient responsible charge related to this service. The patient expressed understanding and agreed to proceed. ? ?Pt location: Home ?Physician Location: office ?Name of referring provider:  Nodal, Alphonzo Dublin, PA-C ?I connected with Roswell Miners Steinberger at patients initiation/request on 05/24/2021 at  1:30 PM EST by video enabled telemedicine application and verified that I am speaking with the correct person using two identifiers. ?Pt MRN:  [id number] ?Pt DOB:  1960-10-30 ?Video Participants:  Kaye Mitro Boulais ? 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  MRI of brain, cervical spine, MRA neck and soft tissue of neck reveal no structural etiology. ?He had recent right sided facial swelling.  Treated with steroids and subsided.  In absence of facial weakness, I cannot say this represented a Bell's palsy/facial nerve palsy.  However, given his complicated history, we can look for other causes of facial nerve palsy.   ?Vision loss/near syncope with head turn to left - no vascular abnormality on MRA of neck.  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  May be vasovagal component. ? ?1  Check ACE, Lyme, SSA/SSB antibodies.  Further recommendations pending results. ? ?History of Present Illness:  ?Larry Marks is a 61 year old male with stage IVA HPV+ squamous cell carcinoma of his left  tonsil who follows up for worsening facial weakness. ? ?UPDATE: ?MRIs personally reviewed:  MRI of brain with and without contrast on 04/10/2021 unremarkable.  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  No change in left sided facial symptoms. ? ?Last week, he developed swelling on the right side of his face.  No facial weakness but he also had facial swelling prior to developing the left facial weakness.  His PCP prescribed him steroids and the swelling went down.  No residual facial weakness.   ?  ?HISTORY: ?Patient completed chemoradiation in November 2017 for squamous cell carcinoma of his left tonsil.  He developed tightness of his neck following treatment and has decreased range of motion  Around 2019, he would start experiencing vision loss in his left eye and tingling on the left side of his head.  He would quickly turn his neck back before symptoms progressed.  For the past few months, symptoms have gotten worse.  He has an aural fullness in his left ear with numbness behind the ear.  He started noticing knots on the left side of his neck.  Sometimes if he would press it, he would feel the left sided numbness of the head and neck.  No pain or numbness involving the left arm.  He was concerned about recurrent disease.  CT face and soft tissue of neck in November showed mild fullness of the soft tissues posterior  to left ear and post radiation soft tissue changes in the neck bilaterally.  MRI of orbit, face and neck on 01/26/2021 showed post radiation mucosal and submucosal changes but no evidence of residual or recurrent SSC or lymphadenopathy.  MRA of neck was performed which was unremarkable.  His PCP thought that the "knots" were likely muscle spasms and he was prescribed muscle relaxers and prednisone  which helped but symptoms returned once he finished the prednisone In December 2022, he developed left upper and lower facial weakness.  It progressed over 2 days.  He cannot close his eyes and he has left facial droop.  No associated facial numbness, altered taste or hyperacusis in left ear.  He was diagnosed with Bell's palsy.  He did not receive prednisone or valacyclovir.  He also has noticed some increased trouble swallowing.   ? ?Past Medical History: ?Past Medical History:  ?Diagnosis Date  ? Throat cancer ([place])   ? ? ?Medications: ?No outpatient encounter medications on file as of 05/24/2021.  ? ?No facility-administered encounter medications on file as of 05/24/2021.  ? ? ?Allergies: ?Allergies  ?Allergen Reactions  ? Iodine Anaphylaxis  ? Other Anaphylaxis  ? Shellfish Allergy Anaphylaxis  ? Penicillins Hives  ?  Other reaction(s): Other (See Comments) ?Unknown ?  ? Ivp Dye [Iodinated Contrast Media]   ? ? ?Family History: ?No family history on file. ? ?Observations/Objective:   ?No acute distress.  Alert and oriented.  Speech fluent and not dysarthric.  Language intact.  Eyes orthophoric on primary gaze.  Left sided upper and lower facial weakness. ? ? ?Follow Up Instructions: ?  ? -I discussed the assessment and treatment plan with the patient. The patient was provided an opportunity to ask questions and all were answered. The patient agreed with the plan and demonstrated an understanding of the instructions. ?  ?The patient was advised to call back or seek an in-person evaluation if the symptoms worsen or if the condition fails to improve as anticipated. ? ? ?Dudley Major, DO ? ?

## 2021-05-24 NOTE — Telephone Encounter (Signed)
Pt called in and left a message with the access nurse. He wanted to speak with someone about his ongoing symptoms of Bells Palsy and see if he should come in for a sooner visit than 09/28/21. He saw his PCP told him to contact our office.  ?

## 2021-05-25 LAB — SJOGREN'S SYNDROME ANTIBODS(SSA + SSB)
SSA (Ro) (ENA) Antibody, IgG: <1 AI
SSB (La) (ENA) Antibody, IgG: <1 AI

## 2021-05-25 LAB — ANGIOTENSIN CONVERTING ENZYME: Angiotensin-Converting Enzyme: 16 U/L (ref 9–67)

## 2021-05-26 LAB — LYME DISEASE, WESTERN BLOT
"  IgG P18 Ab.": ABSENT
"  IgG P23 Ab.": ABSENT
"  IgG P28 Ab.": ABSENT
"  IgG P30 Ab.": ABSENT
"  IgG P39 Ab.": ABSENT
"  IgG P41 Ab.": ABSENT
"  IgG P45 Ab.": ABSENT
"  IgG P58 Ab.": ABSENT
"  IgG P66 Ab.": ABSENT
"  IgG P93 Ab.": ABSENT
"  IgM P23 Ab.": ABSENT
"  IgM P39 Ab.": ABSENT
"  IgM P41 Ab.": ABSENT
Lyme IgG Wb: NEGATIVE
Lyme IgM Wb: NEGATIVE

## 2021-06-01 ENCOUNTER — Telehealth: Payer: Self-pay | Admitting: Neurology

## 2021-06-01 NOTE — Telephone Encounter (Signed)
Patient stated he has not recvd a call regarding lab work ?

## 2021-06-01 NOTE — Progress Notes (Signed)
Patient advised of his lab results.

## 2021-06-01 NOTE — Telephone Encounter (Signed)
Please see results note.

## 2021-06-01 NOTE — Telephone Encounter (Signed)
Patient called and said he is needing to know what his next steps are after getting his blood test results? ?

## 2021-06-02 NOTE — Telephone Encounter (Signed)
MRI Face ordered per Dr.Jaffe note,  ?We can check MRI of facial nerve with and without contrast for facial nerve palsy. ?

## 2021-06-06 ENCOUNTER — Telehealth: Payer: Self-pay | Admitting: Neurology

## 2021-06-06 NOTE — Telephone Encounter (Signed)
Patient called to see if Dr. 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 ? ?He said he is continuing to loose weight at is down to 116 from 198 pounds. ?

## 2021-06-07 NOTE — Telephone Encounter (Signed)
Patient called stating he was returning a call.  Please call back. ?

## 2021-06-07 NOTE — Telephone Encounter (Signed)
Tried calling patient no answer. LMOVM ?Does not sound like Ramsay Hunt but like Bell's palsy, it is an isolated monophasic viral inflammation of facial nerves, not progressive.  I would follow up with oncology or PCP regarding weight loss  ?

## 2021-06-08 NOTE — Telephone Encounter (Signed)
Called patient and informed him that per Dr. Tomi Likens it does not sound like Ramsay Hunt but like Bell's palsy, it is an isolated monophasic viral inflammation of facial nerves, not progressive.  Dr. Tomi Likens stated to follow up with oncology or PCP regarding weight loss. ? ?Patient verbalized understanding and had no further questions or concerns. ?

## 2021-06-23 ENCOUNTER — Ambulatory Visit
Admission: RE | Admit: 2021-06-23 | Discharge: 2021-06-23 | Disposition: A | Discharge status: Home / Self Care | Payer: Self-pay | Source: Ambulatory Visit | Attending: Neurology | Admitting: Neurology

## 2021-06-23 DIAGNOSIS — G510 Bell's palsy: Secondary | ICD-10-CM

## 2021-06-23 IMAGING — MR MR FACE/TRIGEMINAL WO/W CM
7 of 10 series · 29 of 48 positions shown · IV contrast (multihance)
Comparison: No pertinent prior exam.

CLINICAL DATA: Facial nerve palsy

EXAM:
MRI FACE TRIGEMINAL WITHOUT AND WITH CONTRAST
TECHNIQUE: Multiplanar, multi-echo pulse sequences of the face and surrounding
structures, including thin-slice imaging of the trigeminal nerves,
were acquired before and after intravenous contrast administration.
CONTRAST:  10mL MULTIHANCE GADOBENATE DIMEGLUMINE 529 MG/ML IV SOLN

[Series 2: T1 · sagittal · 3.0mm · 0.41mm/px · 4 of 40 slices shown (1 of 5)]
[im 1/40]
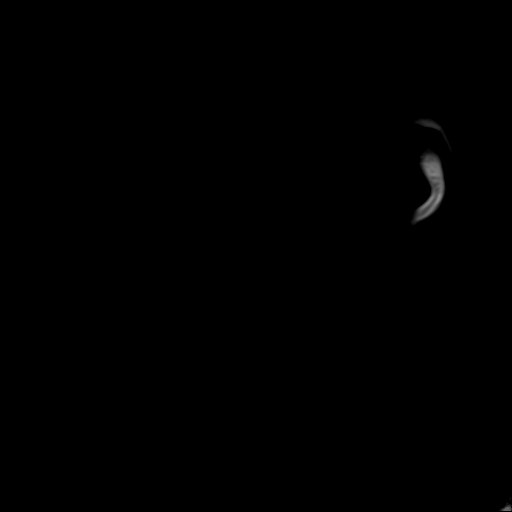
[im 14/40]
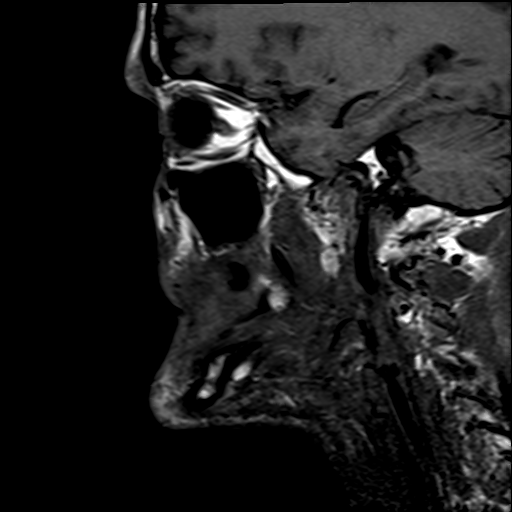
[im 27/40]
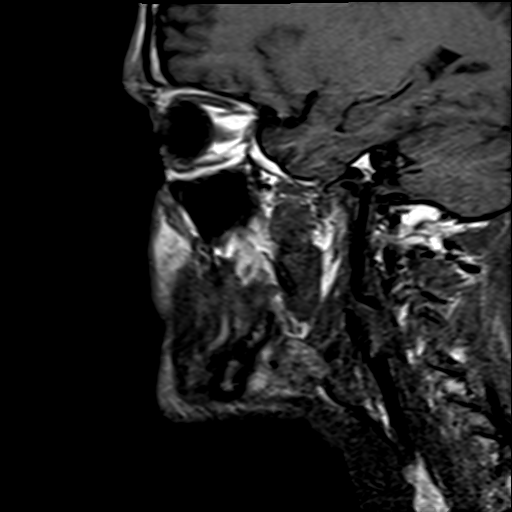
[im 40/40]
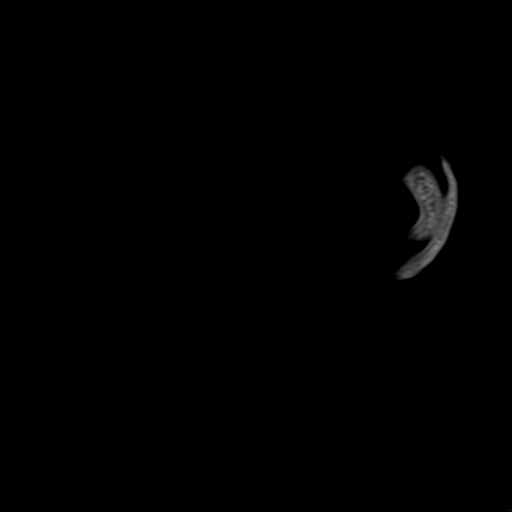

[Series 3: T2 · coronal · 3.0mm · 0.39mm/px · 5 of 55 slices shown]
[im 1/55]
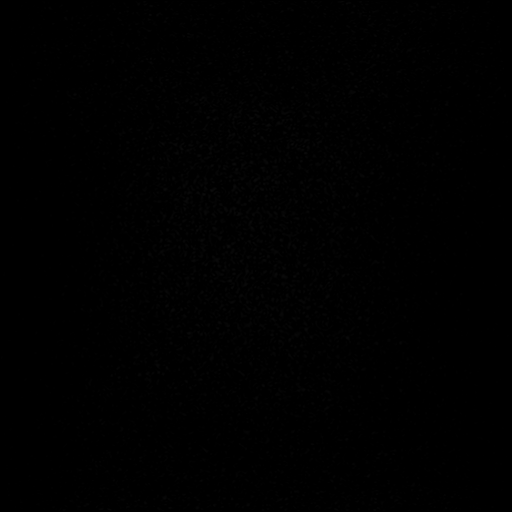
[im 14/55]
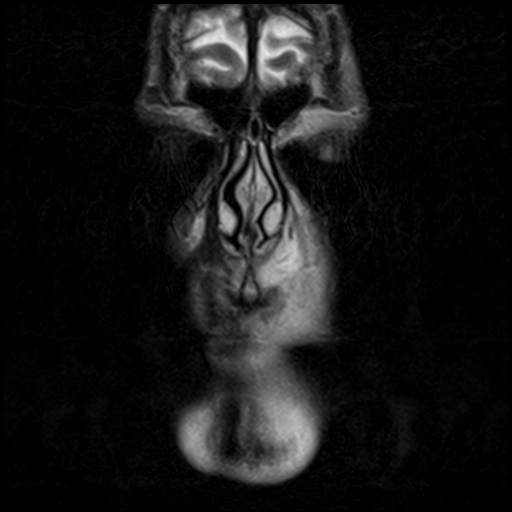
[im 28/55]
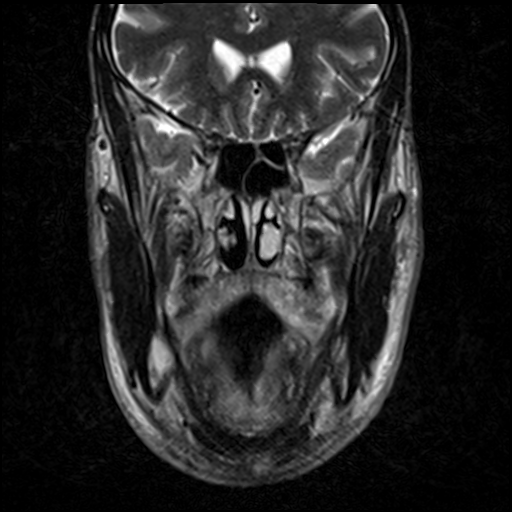
[im 41/55]
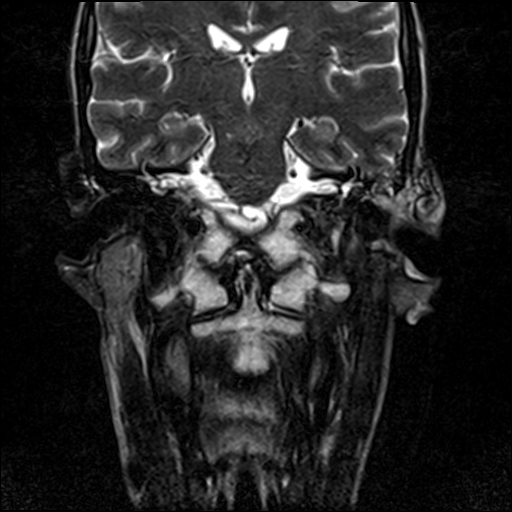
[im 55/55]
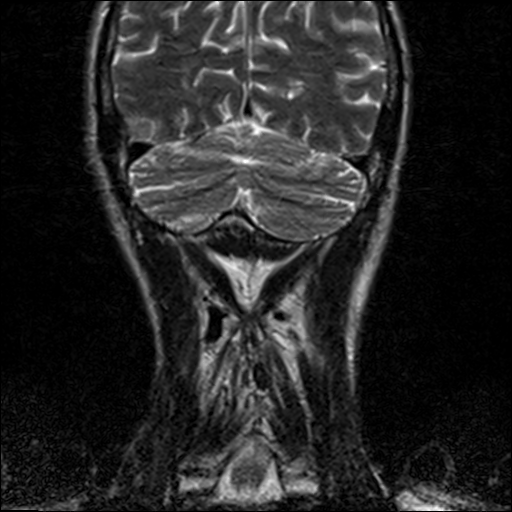

[Series 5: T1 · coronal · 3.0mm · 0.39mm/px · 4 of 51 slices shown (2 of 5)]
[im 1/51]
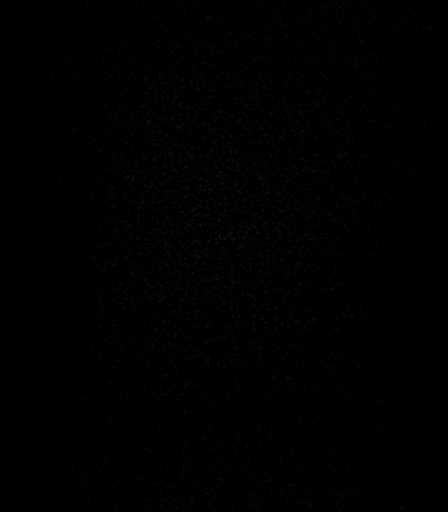
[im 17/51]
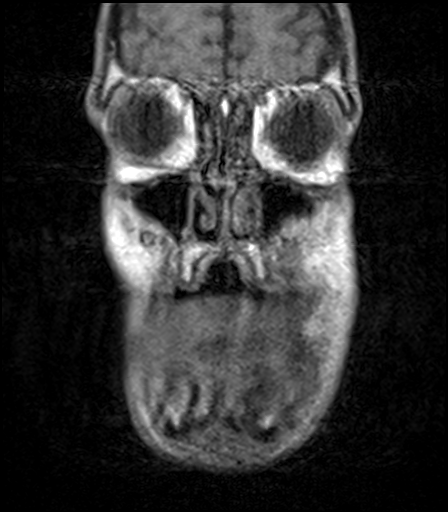
[im 34/51]
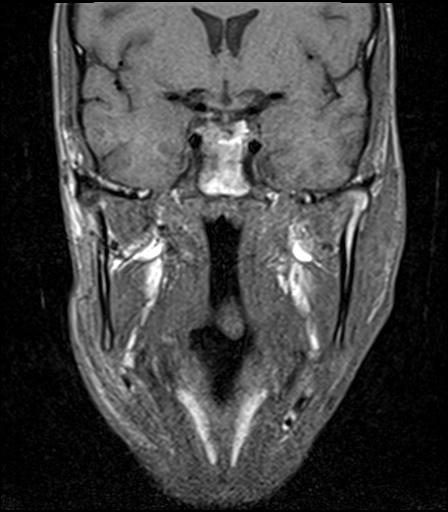
[im 51/51]
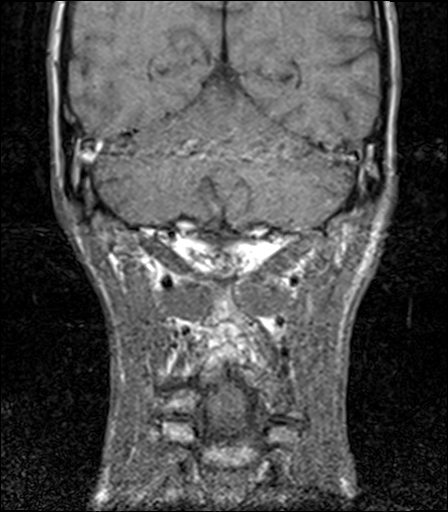

[Series 7: T1 · axial · 3.0mm · 0.70mm/px · z∈[-148,+13]mm · 4 of 50 slices shown (3 of 5)]
[im 1/50]
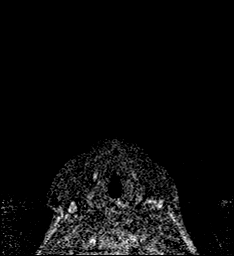
[im 17/50]
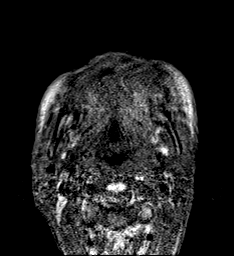
[im 33/50]
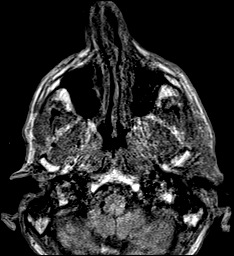
[im 50/50]
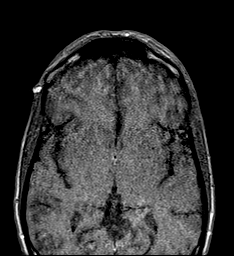

[Series 9: T1 · axial · 3.0mm · 0.70mm/px · z∈[-148,+13]mm · 4 of 50 slices shown (4 of 5)]
[im 1/50]
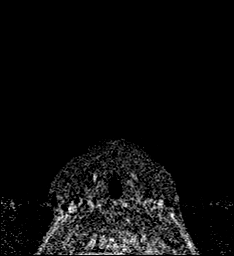
[im 17/50]
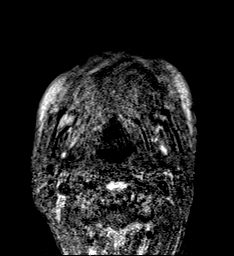
[im 33/50]
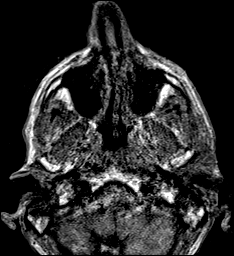
[im 50/50]
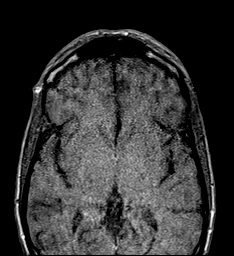

[Series 10: T1 · coronal · 3.0mm · 0.39mm/px · 4 of 51 slices shown (5 of 5)]
[im 1/51]
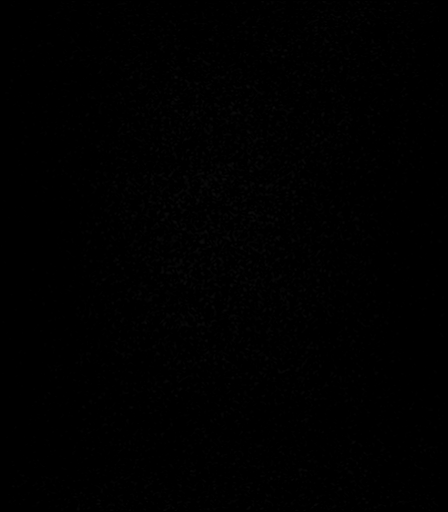
[im 17/51]
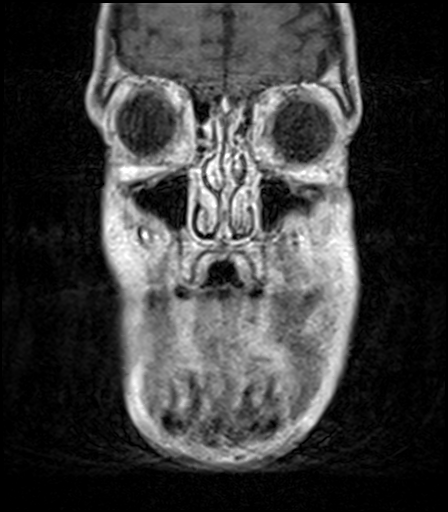
[im 34/51]
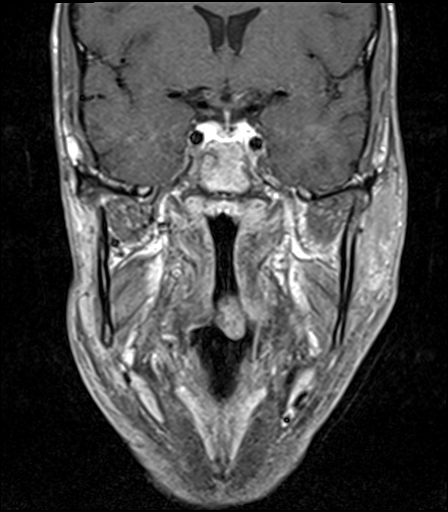
[im 51/51]
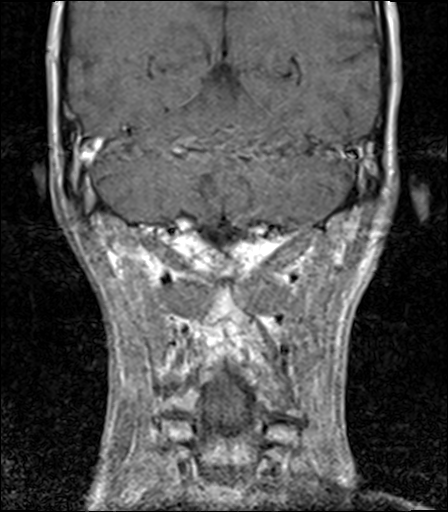

[Series 11: T1 fat-sat · axial · 3.0mm · 0.70mm/px · z∈[-148,+13]mm · 4 of 50 slices shown]
[im 1/50]
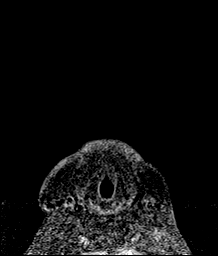
[im 17/50]
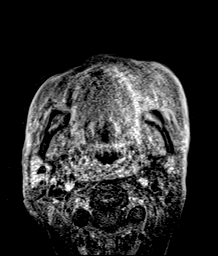
[im 33/50]
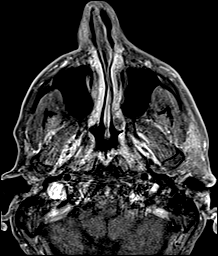
[im 50/50]
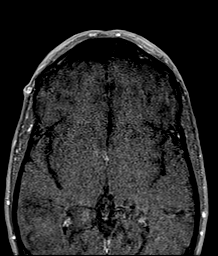

[29 of 48 positions shown; findings below may reference images not displayed]

FINDINGS: There is a large degree of motion and this severely limits
assessment of small structures such as cranial nerves.

Limited intracranial/Trigeminal nerves: Visualized intracranial
structures are normal. Meckel's caves are normal. There is no
abnormal contrast enhancement along the cisternal segment of the
trigeminal nerve or at foramina rotundum and ovale. Signal within
the sphenopalatine fossa is normal. Visualization of the facial
nerve is poor. On the STIR sequence in the postcontrast imaging,
there is signal abnormality within the superficial left face that
tracks posteriorly near the stylomastoid foramen.

Vascular: Normal flow voids.

Sinuses/Orbits: No acute or significant finding.

Soft tissues: Left facial edema as above.

Osseous: Normal

Other: None.
IMPRESSION: 1. Area of signal abnormality within the superficial left face that
tracks posteriorly near the stylomastoid foramen and likely affects
the facial nerve. More detailed analysis is difficult due to the
degree of motion.
2. It may be helpful to repeat the examination when the patient is
better able to remain still, perhaps under sedation.

## 2021-06-23 MED ORDER — GADOBENATE DIMEGLUMINE 529 MG/ML IV SOLN
10.0000 mL | Freq: Once | INTRAVENOUS | Status: AC | PRN
  Administered 2021-06-23: 10 mL via INTRAVENOUS

## 2021-07-01 ENCOUNTER — Other Ambulatory Visit: Payer: Self-pay | Admitting: Neurology

## 2021-07-01 MED ORDER — DIAZEPAM 5 MG PO TABS: ORAL_TABLET | ORAL | 0 refills | Status: AC

## 2021-09-27 NOTE — Progress Notes (Deleted)
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  MRI of brain, cervical spine, MRA neck and soft tissue of neck reveal no structural etiology. He had recent right sided facial swelling.  Treated with steroids and subsided.  In absence of facial weakness, I cannot say this represented a Bell's palsy/facial nerve palsy.  However, given his complicated history, we can look for other causes of facial nerve palsy.   Vision loss/near syncope with head turn to left - no vascular abnormality on MRA of neck.  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  May be vasovagal component.   1  Check ACE, Lyme, SSA/SSB antibodies.  Further recommendations pending results.  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   UPDATE: Labs from March include B12 225, TSH 3.95, negative Lyme, negative SSA/SSB antibodies and ACE 16.  He had 70 + lb weight loss.  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  MRI soft tissue neck on 08/11/2021 revealed infiltrative parotid and periauricular mass as well as perineural involvement of the left facial nerve, left auriculotemporal nerve and left inferior alveolar nerve.  Biopsy confirmed HNSCC p16+   HISTORY: Patient completed chemoradiation in November 2017 for squamous cell carcinoma of his left tonsil.  He developed tightness of his neck following treatment and has decreased range of motion  Around 2019, he would start experiencing vision loss in his left eye and tingling on the left side of his head.  He would quickly turn his neck back before symptoms progressed.  For the past few months, symptoms have gotten worse.  He has an aural fullness in his  left ear with numbness behind the ear.  He started noticing knots on the left side of his neck.  Sometimes if he would press it, he would feel the left sided numbness of the head and neck.  No pain or numbness involving the left arm.  He was concerned about recurrent disease.  CT face and soft tissue of neck in November showed mild fullness of the soft tissues posterior to left ear and post radiation soft tissue changes in the neck bilaterally.  MRI of orbit, face and neck on 01/26/2021 showed post radiation mucosal and submucosal changes but no evidence of residual or recurrent SSC or lymphadenopathy.  MRA of neck was performed which was unremarkable.  His PCP thought that the "knots" were likely muscle spasms and he was prescribed muscle relaxers and prednisone which helped but symptoms returned once he finished the prednisone In December 2022, he developed left upper and lower facial weakness.  It progressed over 2 days.  He cannot close his eyes and he has left facial droop.  No associated facial numbness, altered taste or hyperacusis in left ear.  He was diagnosed with Bell's palsy.  He did not receive prednisone or valacyclovir.  He also has noticed some increased trouble swallowing.   MRI of brain with and without contrast on 04/10/2021 unremarkable.  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  No change in left sided facial symptoms.  PAST MEDICAL HISTORY: Past Medical History:  Diagnosis Date   Throat cancer [facility])     MEDICATIONS: Current Outpatient Medications on File Prior to Visit  Medication Sig Dispense Refill   diazepam (VALIUM) 5 MG tablet Take 1 tablet 30-40 minutes prior to MRI.  May repeat at MRI if needed. 2 tablet 0   No current facility-administered medications on file prior to visit.     ALLERGIES: Allergies  Allergen Reactions   Iodine Anaphylaxis   Other Anaphylaxis   Shellfish Allergy Anaphylaxis   Penicillins Hives    Other reaction(s): Other (See Comments) Unknown    Ivp Dye [Iodinated Contrast Media]     FAMILY HISTORY: No family history on file.    Objective:  *** General: No acute distress.  Patient appears well-groomed.   Head:  Normocephalic/atraumatic Eyes:  Fundi examined but not visualized Neck: supple, no paraspinal tenderness, full range of motion Heart:  Regular rate and rhythm Lungs:  Clear to auscultation bilaterally Back: No paraspinal tenderness Neurological Exam: alert and oriented to person, place, and time.  Speech fluent and not dysarthric, language intact.  CN II-XII intact. Bulk and tone normal, muscle strength 5/5 throughout.  Sensation to light touch intact.  Deep tendon reflexes 2+ throughout, toes downgoing.  Finger to nose testing intact.  Gait normal, Romberg negative.   Metta Clines, DO  CC: Loura Back, PA-C

## 2021-09-28 ENCOUNTER — Ambulatory Visit: Payer: Medicaid Other | Admitting: Neurology

## 2021-09-28 ENCOUNTER — Encounter: Payer: Self-pay | Admitting: Neurology

## 2021-09-28 DIAGNOSIS — Z029 Encounter for administrative examinations, unspecified: Secondary | ICD-10-CM

## 2022-04-20 DEATH — deceased
